# Patient Record
Sex: Female | Born: 1990 | Race: Black or African American | Hispanic: No | Marital: Single | Smoking: Current some day smoker
Health system: Southern US, Community
[De-identification: ages and names within clinical notes are randomized; demographics above are authoritative.]

## PROBLEM LIST (undated history)

## (undated) ENCOUNTER — Inpatient Hospital Stay (HOSPITAL_COMMUNITY): Payer: Self-pay

## (undated) DIAGNOSIS — Z789 Other specified health status: Secondary | ICD-10-CM

## (undated) DIAGNOSIS — O24419 Gestational diabetes mellitus in pregnancy, unspecified control: Secondary | ICD-10-CM

## (undated) HISTORY — PX: DILATION AND CURETTAGE OF UTERUS: SHX78

---

## 2015-12-14 ENCOUNTER — Emergency Department (HOSPITAL_COMMUNITY)
Admission: EM | Admit: 2015-12-14 | Discharge: 2015-12-14 | Disposition: A | Discharge status: Home / Self Care | Payer: Medicaid Other | Attending: Emergency Medicine | Admitting: Emergency Medicine

## 2015-12-14 ENCOUNTER — Encounter (HOSPITAL_COMMUNITY): Payer: Self-pay | Admitting: *Deleted

## 2015-12-14 DIAGNOSIS — W228XXA Striking against or struck by other objects, initial encounter: Secondary | ICD-10-CM | POA: Insufficient documentation

## 2015-12-14 DIAGNOSIS — Y998 Other external cause status: Secondary | ICD-10-CM | POA: Insufficient documentation

## 2015-12-14 DIAGNOSIS — F17200 Nicotine dependence, unspecified, uncomplicated: Secondary | ICD-10-CM | POA: Diagnosis not present

## 2015-12-14 DIAGNOSIS — Y9389 Activity, other specified: Secondary | ICD-10-CM | POA: Insufficient documentation

## 2015-12-14 DIAGNOSIS — S8992XA Unspecified injury of left lower leg, initial encounter: Secondary | ICD-10-CM | POA: Insufficient documentation

## 2015-12-14 DIAGNOSIS — S8001XA Contusion of right knee, initial encounter: Secondary | ICD-10-CM | POA: Diagnosis not present

## 2015-12-14 DIAGNOSIS — Y9289 Other specified places as the place of occurrence of the external cause: Secondary | ICD-10-CM | POA: Insufficient documentation

## 2015-12-14 DIAGNOSIS — S8991XA Unspecified injury of right lower leg, initial encounter: Secondary | ICD-10-CM | POA: Diagnosis present

## 2015-12-14 DIAGNOSIS — M25562 Pain in left knee: Secondary | ICD-10-CM

## 2015-12-14 NOTE — Discharge Instructions (Signed)
CONTINUE IBUPROFEN 600 MG EVERY 6 HOURS FOR PAIN AND INFLAMMATION. USE KNEE BRACE ON LEFT KNEE FOR COMFORT.  FOLLOW UP WITH YOUR CHOICE OF PRIMARY CARE PROVIDER OR WITH ORTHOPEDICS (DR. August Saucer) IF PAIN PERSISTS.    Cryotherapy Cryotherapy means treatment with cold. Ice or gel packs can be used to reduce both pain and swelling. Ice is the most helpful within the first 24 to 48 hours after an injury or flare-up from overusing a muscle or joint. Sprains, strains, spasms, burning pain, shooting pain, and aches can all be eased with ice. Ice can also be used when recovering from surgery. Ice is effective, has very few side effects, and is safe for most people to use. PRECAUTIONS  Ice is not a safe treatment option for people with:  Raynaud phenomenon. This is a condition affecting small blood vessels in the extremities. Exposure to cold may cause your problems to return.  Cold hypersensitivity. There are many forms of cold hypersensitivity, including:  Cold urticaria. Red, itchy hives appear on the skin when the tissues begin to warm after being iced.  Cold erythema. This is a red, itchy rash caused by exposure to cold.  Cold hemoglobinuria. Red blood cells break down when the tissues begin to warm after being iced. The hemoglobin that carry oxygen are passed into the urine because they cannot combine with blood proteins fast enough.  Numbness or altered sensitivity in the area being iced. If you have any of the following conditions, do not use ice until you have discussed cryotherapy with your caregiver:  Heart conditions, such as arrhythmia, angina, or chronic heart disease.  High blood pressure.  Healing wounds or open skin in the area being iced.  Current infections.  Rheumatoid arthritis.  Poor circulation.  Diabetes. Ice slows the blood flow in the region it is applied. This is beneficial when trying to stop inflamed tissues from spreading irritating chemicals to surrounding tissues.  However, if you expose your skin to cold temperatures for too long or without the proper protection, you can damage your skin or nerves. Watch for signs of skin damage due to cold. HOME CARE INSTRUCTIONS Follow these tips to use ice and cold packs safely.  Place a dry or damp towel between the ice and skin. A damp towel will cool the skin more quickly, so you may need to shorten the time that the ice is used.  For a more rapid response, add gentle compression to the ice.  Ice for no more than 10 to 20 minutes at a time. The bonier the area you are icing, the less time it will take to get the benefits of ice.  Check your skin after 5 minutes to make sure there are no signs of a poor response to cold or skin damage.  Rest 20 minutes or more between uses.  Once your skin is numb, you can end your treatment. You can test numbness by very lightly touching your skin. The touch should be so light that you do not see the skin dimple from the pressure of your fingertip. When using ice, most people will feel these normal sensations in this order: cold, burning, aching, and numbness.  Do not use ice on someone who cannot communicate their responses to pain, such as small children or people with dementia. HOW TO MAKE AN ICE PACK Ice packs are the most common way to use ice therapy. Other methods include ice massage, ice baths, and cryosprays. Muscle creams that cause a cold,  tingly feeling do not offer the same benefits that ice offers and should not be used as a substitute unless recommended by your caregiver. To make an ice pack, do one of the following:  Place crushed ice or a bag of frozen vegetables in a sealable plastic bag. Squeeze out the excess air. Place this bag inside another plastic bag. Slide the bag into a pillowcase or place a damp towel between your skin and the bag.  Mix 3 parts water with 1 part rubbing alcohol. Freeze the mixture in a sealable plastic bag. When you remove the mixture  from the freezer, it will be slushy. Squeeze out the excess air. Place this bag inside another plastic bag. Slide the bag into a pillowcase or place a damp towel between your skin and the bag. SEEK MEDICAL CARE IF:  You develop white spots on your skin. This may give the skin a blotchy (mottled) appearance.  Your skin turns blue or pale.  Your skin becomes waxy or hard.  Your swelling gets worse. MAKE SURE YOU:   Understand these instructions.  Will watch your condition.  Will get help right away if you are not doing well or get worse.   This information is not intended to replace advice given to you by your health care provider. Make sure you discuss any questions you have with your health care provider.   Document Released: 06/17/2011 Document Revised: 11/11/2014 Document Reviewed: 06/17/2011 Elsevier Interactive Patient Education [employer]

## 2015-12-14 NOTE — ED Provider Notes (Signed)
CSN: [id number]     Arrival date & time 12/14/15  2037 History  By signing my name below, I, Gonzella Lex, attest that this documentation has been prepared under the direction and in the presence of Elpidio Anis, PA-C. Electronically Signed: Gonzella Lex, Scribe. 12/14/2015. 9:50 PM.   Chief Complaint  Patient presents with  . Knee Pain   The history is provided by the patient. No language interpreter was used.   HPI Comments: Mariah Castaneda is a 25 y.o. female who presents to the Emergency Department complaining of sudden onset, constant, mild deep pain to her left knee, worse at night and worse with remaining stationary, below her patella for the past month. She also notes associated discomfort when sleeping and tingling pain which radiates into her thigh and lower leg. Pt states that she did not experience any type of accident, trauma or injury to her left knee before it began to hurt. She notes that her pain is improved with movement and also reports that she began working out and running on pavement a month ago.  Pt also complains of sudden onset, constant, mild, superficial pain to her right knee near her patella, worse with ambulation, which began yesterday evening after she hit her knee while helping move a headboard. She notes associated edema to her right knee which has improved since yesterday. Pt has taken ibuprofen for her left and right knee pain with no relief. Pt denies hx of past knee injury or surgery. She also denies erythema and edema of her left knee, numbness and tingling in her toes, hx of DM and hx of HTN. She does not take any medications regularly. Pt reports a family hx of HTN in her mother.   History reviewed. No pertinent past medical history. Past Surgical History  Procedure Laterality Date  . Cesarean section     No family history on file. Social History  Substance Use Topics  . Smoking status: Current Some Day Smoker  . Smokeless tobacco: None  .  Alcohol Use: No   OB History    No data available     Review of Systems  Musculoskeletal: Positive for myalgias, joint swelling ( right knee) and arthralgias.       Knees   Skin: Negative for color change.  Neurological: Negative for numbness ( left knee and leg).       Tingling sensation in knees bilaterally and in left leg    Allergies  Review of patient's allergies indicates not on file.  Home Medications   Prior to Admission medications   Not on File   BP 118/84 mmHg  Pulse 99  Temp(Src) 99.3 F (37.4 C) (Oral)  Resp 18  Ht 5' (1.524 m)  Wt 160 lb (72.576 kg)  BMI 31.25 kg/m2  SpO2 100%  LMP 10/16/2015 (Exact Date) Physical Exam  Constitutional: She is oriented to person, place, and time. She appears well-developed and well-nourished. No distress.  HENT:  Head: Normocephalic and atraumatic.  Eyes: Conjunctivae are normal.  Cardiovascular: Normal rate and intact distal pulses.   Pulmonary/Chest: Effort normal.  Abdominal: Soft.  Musculoskeletal:  Knees bilaterally without swelling, discoloration or deformity. Joints are stable without laxity. Left knee: no focal tenderness. Right knee: mildly tender medial aspect patella. No deformity. No calf or thigh tenderness bilaterally.  Neurological: She is alert and oriented to person, place, and time.  Skin: Skin is warm and dry.  Psychiatric: She has a normal mood and affect.  Nursing note  and vitals reviewed.   ED Course  Procedures  DIAGNOSTIC STUDIES:    Oxygen Saturation is 100% on RA, normal by my interpretation.   COORDINATION OF CARE:  9:51 PM  Discussed treatment plan with pt at bedside and pt agreed to plan.   Labs Review Labs Reviewed - No data to display  Imaging Review No results found. I have personally reviewed and evaluated these images and lab results as part of my medical decision-making.   EKG Interpretation None      MDM   Final diagnoses:  None    1. Left knee pain 2. Right  knee contusion  Patient is ambulatory without difficulty. No suspicion for septic joint, fractures or dislocations. Discussed supportive care and appropriate follow up if symptoms persist.    I personally performed the services described in this documentation, which was scribed in my presence. The recorded information has been reviewed and is accurate.     Elpidio Anis, PA-C 12/14/15 0454  Nelva Nay, MD 12/16/15 1040

## 2015-12-14 NOTE — ED Notes (Signed)
Pt c/o left knee pain x 1 month and hit her right knee on a headboard and c/o swelling and pain.

## 2016-02-04 ENCOUNTER — Encounter (HOSPITAL_COMMUNITY): Payer: Self-pay | Admitting: *Deleted

## 2016-02-04 ENCOUNTER — Inpatient Hospital Stay (HOSPITAL_COMMUNITY)
Admission: AD | Admit: 2016-02-04 | Discharge: 2016-02-04 | Disposition: A | Discharge status: Home / Self Care | Payer: Medicaid Other | Source: Ambulatory Visit | Attending: Obstetrics and Gynecology | Admitting: Obstetrics and Gynecology

## 2016-02-04 ENCOUNTER — Inpatient Hospital Stay (HOSPITAL_COMMUNITY): Payer: Medicaid Other

## 2016-02-04 DIAGNOSIS — Z3A01 Less than 8 weeks gestation of pregnancy: Secondary | ICD-10-CM | POA: Insufficient documentation

## 2016-02-04 DIAGNOSIS — F17200 Nicotine dependence, unspecified, uncomplicated: Secondary | ICD-10-CM | POA: Diagnosis not present

## 2016-02-04 DIAGNOSIS — R109 Unspecified abdominal pain: Secondary | ICD-10-CM | POA: Diagnosis not present

## 2016-02-04 DIAGNOSIS — O26891 Other specified pregnancy related conditions, first trimester: Secondary | ICD-10-CM | POA: Diagnosis not present

## 2016-02-04 DIAGNOSIS — O9989 Other specified diseases and conditions complicating pregnancy, childbirth and the puerperium: Secondary | ICD-10-CM

## 2016-02-04 DIAGNOSIS — O3680X0 Pregnancy with inconclusive fetal viability, not applicable or unspecified: Secondary | ICD-10-CM

## 2016-02-04 DIAGNOSIS — O26899 Other specified pregnancy related conditions, unspecified trimester: Secondary | ICD-10-CM

## 2016-02-04 DIAGNOSIS — O99331 Smoking (tobacco) complicating pregnancy, first trimester: Secondary | ICD-10-CM | POA: Diagnosis not present

## 2016-02-04 DIAGNOSIS — N898 Other specified noninflammatory disorders of vagina: Secondary | ICD-10-CM | POA: Diagnosis present

## 2016-02-04 HISTORY — DX: Other specified health status: Z78.9

## 2016-02-04 LAB — URINALYSIS, ROUTINE W REFLEX MICROSCOPIC
BILIRUBIN URINE: NEGATIVE
Glucose, UA: NEGATIVE mg/dL
Hgb urine dipstick: NEGATIVE
KETONES UR: 15 mg/dL — AB
LEUKOCYTES UA: NEGATIVE
NITRITE: NEGATIVE
PH: 6 (ref 5.0–8.0)
PROTEIN: NEGATIVE mg/dL
Specific Gravity, Urine: 1.025 (ref 1.005–1.030)

## 2016-02-04 LAB — HCG, QUANTITATIVE, PREGNANCY: HCG, BETA CHAIN, QUANT, S: 1289 m[IU]/mL — AB (ref <5)

## 2016-02-04 LAB — POCT PREGNANCY, URINE: PREG TEST UR: POSITIVE — AB

## 2016-02-04 LAB — CBC
HEMATOCRIT: 36 % (ref 36.0–46.0)
HEMOGLOBIN: 12.9 g/dL (ref 12.0–15.0)
MCH: 30.5 pg (ref 26.0–34.0)
MCHC: 35.8 g/dL (ref 30.0–36.0)
MCV: 85.1 fL (ref 78.0–100.0)
Platelets: 251 10*3/uL (ref 150–400)
RBC: 4.23 MIL/uL (ref 3.87–5.11)
RDW: 13.5 % (ref 11.5–15.5)
WBC: 6.5 10*3/uL (ref 4.0–10.5)

## 2016-02-04 LAB — WET PREP, GENITAL
Sperm: NONE SEEN
TRICH WET PREP: NONE SEEN
YEAST WET PREP: NONE SEEN

## 2016-02-04 NOTE — MAU Note (Signed)
Patient presents with a +HPT this morning and c/o abdominal pain today as well as vaginal discharge since Friday.

## 2016-02-04 NOTE — Discharge Instructions (Signed)

## 2016-02-04 NOTE — MAU Provider Note (Signed)
History     CSN: [id number]  Arrival date and time: 02/04/16 2042   First Provider Initiated Contact with Patient 02/04/16 2114      Chief Complaint  Patient presents with  . Vaginal Discharge   Vaginal Discharge The patient's primary symptoms include pelvic pain and vaginal discharge. This is a new problem. The current episode started yesterday. The problem occurs constantly. The problem has been unchanged. Pain severity now: 4/10  The problem affects the right side. She is pregnant. Associated symptoms include abdominal pain. Pertinent negatives include no chills, constipation, diarrhea, dysuria, fever, frequency, nausea, urgency or vomiting. The vaginal discharge was malodorous and white. There has been no bleeding. Nothing aggravates the symptoms. She has tried nothing for the symptoms. She is sexually active. It is possible that her partner has an STD. She uses nothing for contraception. Her menstrual history has been regular (LMP 12/31/15 ).    Past Medical History  Diagnosis Date  . Medical history non-contributory     Past Surgical History  Procedure Laterality Date  . Cesarean section    . Dilation and curettage of uterus      History reviewed. No pertinent family history.  Social History  Substance Use Topics  . Smoking status: Current Some Day Smoker  . Smokeless tobacco: Never Used  . Alcohol Use: No    Allergies: Allergies not on file  No prescriptions prior to admission    Review of Systems  Constitutional: Negative for fever and chills.  Gastrointestinal: Positive for abdominal pain. Negative for nausea, vomiting, diarrhea and constipation.  Genitourinary: Positive for vaginal discharge and pelvic pain. Negative for dysuria, urgency and frequency.   Physical Exam   Blood pressure 130/80, pulse 97, temperature 98.6 F (37 C), temperature source Oral, resp. rate 20, height 5' (1.524 m), weight 72.576 kg (160 lb), last menstrual period  12/31/2015.  Physical Exam  Nursing note and vitals reviewed. Constitutional: She is oriented to person, place, and time. She appears well-developed and well-nourished. No distress.  HENT:  Head: Normocephalic.  Cardiovascular: Normal rate.   Respiratory: Effort normal.  GI: Soft. There is no tenderness. There is no rebound.  Musculoskeletal: Normal range of motion.  Neurological: She is alert and oriented to person, place, and time.  Skin: Skin is warm and dry.  Psychiatric: She has a normal mood and affect.   Results for orders placed or performed during the hospital encounter of 02/04/16 (from the past 24 hour(s))  Urinalysis, Routine w reflex microscopic (not at [facility])     Status: Abnormal   Collection Time: 02/04/16  8:50 PM  Result Value Ref Range   Color, Urine YELLOW YELLOW   APPearance CLEAR CLEAR   Specific Gravity, Urine 1.025 1.005 - 1.030   pH 6.0 5.0 - 8.0   Glucose, UA NEGATIVE NEGATIVE mg/dL   Hgb urine dipstick NEGATIVE NEGATIVE   Bilirubin Urine NEGATIVE NEGATIVE   Ketones, ur 15 (A) NEGATIVE mg/dL   Protein, ur NEGATIVE NEGATIVE mg/dL   Nitrite NEGATIVE NEGATIVE   Leukocytes, UA NEGATIVE NEGATIVE  Pregnancy, urine POC     Status: Abnormal   Collection Time: 02/04/16  9:10 PM  Result Value Ref Range   Preg Test, Ur POSITIVE (A) NEGATIVE  Wet prep, genital     Status: Abnormal   Collection Time: 02/04/16  9:13 PM  Result Value Ref Range   Yeast Wet Prep HPF POC NONE SEEN NONE SEEN   Trich, Wet Prep NONE SEEN NONE SEEN  Clue Cells Wet Prep HPF POC PRESENT (A) NONE SEEN   WBC, Wet Prep HPF POC FEW (A) NONE SEEN   Sperm NONE SEEN   CBC     Status: None   Collection Time: 02/04/16  9:21 PM  Result Value Ref Range   WBC 6.5 4.0 - 10.5 K/uL   RBC 4.23 3.87 - 5.11 MIL/uL   Hemoglobin 12.9 12.0 - 15.0 g/dL   HCT 69.636.0 29.536.0 - 28.446.0 %   MCV 85.1 78.0 - 100.0 fL   MCH 30.5 26.0 - 34.0 pg   MCHC 35.8 30.0 - 36.0 g/dL   RDW 13.213.5 44.011.5 - 10.215.5 %   Platelets 251 150  - 400 K/uL  ABO/Rh     Status: None (Preliminary result)   Collection Time: 02/04/16  9:21 PM  Result Value Ref Range   ABO/RH(D) A POS   hCG, quantitative, pregnancy     Status: Abnormal   Collection Time: 02/04/16  9:21 PM  Result Value Ref Range   hCG, Beta Chain, Quant, S 1289 (H) <5 mIU/mL   [place] Ob Comp Less 14 Wks  02/04/2016  CLINICAL DATA:  Acute onset of intermittent pelvic pain. Initial encounter. EXAM: OBSTETRIC <14 WK US AND TRANSVAGINAL OB US TECHNIQUE: Both transabdominal and transvaginal ultrasound examinations were performed for complete evaluation of the gestation as well as the maternal uterus, adnexal regions, and pelvic cul-de-sac. Transvaginal technique was performed to assess early pregnancy. COMPARISON:  None. FINDINGS: Intrauterine gestational sac: Probably seen. Yolk sac:  No Embryo:  No MSD: 2.3 mm   4 w   6  d Subchorionic hemorrhage:  None visualized. Maternal uterus/adnexae: The uterus is otherwise unremarkable. The ovaries are within normal limits. The right ovary measures 2.8 x 1.2 x 1.8 cm, while the left ovary measures 3.4 x 2.7 x 2.4 cm. No suspicious adnexal masses are seen; there is no evidence for ovarian torsion. No free fluid is seen within the pelvic cul-de-sac. IMPRESSION: Single 2 mm intrauterine gestational sac suggested, corresponding to a gestational age of [redacted] weeks 6 days. This matches the gestational age of [redacted] weeks 0 days by LMP, reflecting an estimated date of delivery of October 06, 2016. Electronically Signed   By: Roanna RaiderJeffery  Chang M.D.   On: 02/04/2016 22:26   [place] Ob Transvaginal  02/04/2016  CLINICAL DATA:  Acute onset of intermittent pelvic pain. Initial encounter. EXAM: OBSTETRIC <14 WK US AND TRANSVAGINAL OB US TECHNIQUE: Both transabdominal and transvaginal ultrasound examinations were performed for complete evaluation of the gestation as well as the maternal uterus, adnexal regions, and pelvic cul-de-sac. Transvaginal technique was performed to assess  early pregnancy. COMPARISON:  None. FINDINGS: Intrauterine gestational sac: Probably seen. Yolk sac:  No Embryo:  No MSD: 2.3 mm   4 w   6  d Subchorionic hemorrhage:  None visualized. Maternal uterus/adnexae: The uterus is otherwise unremarkable. The ovaries are within normal limits. The right ovary measures 2.8 x 1.2 x 1.8 cm, while the left ovary measures 3.4 x 2.7 x 2.4 cm. No suspicious adnexal masses are seen; there is no evidence for ovarian torsion. No free fluid is seen within the pelvic cul-de-sac. IMPRESSION: Single 2 mm intrauterine gestational sac suggested, corresponding to a gestational age of [redacted] weeks 6 days. This matches the gestational age of [redacted] weeks 0 days by LMP, reflecting an estimated date of delivery of October 06, 2016. Electronically Signed   By: Roanna RaiderJeffery  Chang M.D.   On: 02/04/2016 22:26  MAU Course  Procedures  MDM   Assessment and Plan   1. Pregnancy, location unknown   2. Abdominal pain in pregnancy    DC home Comfort measures reviewed  1st Trimester precautions  Bleeding precautions Ectopic precautions RX: NONE  Return to MAU as needed   Follow-up Information    Follow up with [facility].   Specialty:  Obstetrics and Gynecology   Why:  Wednesday 02/07/16 at 11:00 AM   Contact information:   [address] [place] [address] [phone]        Tawnya Crook 02/04/2016, 9:17 PM

## 2016-02-05 LAB — GC/CHLAMYDIA PROBE AMP (~~LOC~~) NOT AT ARMC
Chlamydia: POSITIVE — AB
Neisseria Gonorrhea: NEGATIVE

## 2016-02-05 LAB — ABO/RH: ABO/RH(D): A POS

## 2016-02-06 ENCOUNTER — Telehealth (HOSPITAL_COMMUNITY): Payer: Self-pay | Admitting: *Deleted

## 2016-02-06 DIAGNOSIS — A749 Chlamydial infection, unspecified: Secondary | ICD-10-CM

## 2016-02-06 LAB — HIV ANTIBODY (ROUTINE TESTING W REFLEX): HIV Screen 4th Generation wRfx: NONREACTIVE

## 2016-02-06 LAB — RPR: RPR Ser Ql: NONREACTIVE

## 2016-02-06 MED ORDER — AZITHROMYCIN 500 MG PO TABS
ORAL_TABLET | ORAL | Status: DC
Start: 2016-02-06 — End: 2016-03-10

## 2016-02-06 NOTE — Telephone Encounter (Signed)
Telephone call to patient regarding positive chlamydia culture, patient notified.  Patient has not been treated.  Rx routed to pharmacy per protocol.  Instructed patient to notify her partner for treatment and to abstain from sex for seven days post treatment.  Report faxed to health department.

## 2016-02-07 ENCOUNTER — Ambulatory Visit (INDEPENDENT_AMBULATORY_CARE_PROVIDER_SITE_OTHER): Payer: Medicaid Other | Admitting: Obstetrics and Gynecology

## 2016-02-07 DIAGNOSIS — O9989 Other specified diseases and conditions complicating pregnancy, childbirth and the puerperium: Secondary | ICD-10-CM | POA: Diagnosis present

## 2016-02-07 DIAGNOSIS — R102 Pelvic and perineal pain: Secondary | ICD-10-CM | POA: Diagnosis not present

## 2016-02-07 DIAGNOSIS — O3680X0 Pregnancy with inconclusive fetal viability, not applicable or unspecified: Secondary | ICD-10-CM

## 2016-02-07 LAB — HCG, QUANTITATIVE, PREGNANCY: HCG, BETA CHAIN, QUANT, S: 5423 m[IU]/mL — AB (ref <5)

## 2016-02-07 NOTE — Progress Notes (Signed)
Patient ID: Mariah Castaneda, female   DOB: 21-Jul-1991, 25 y.o.   MRN: [id number] Ms. Mariah Castaneda  is a 25 y.o. [id number] at Unknown who [facility] today for follow-up quant hCG after 48 hours. The patient was seen in MAU on 02/04/16 and had quant hCG of 1800 and US showed possible IUGS. She denies pain, vaginal bleeding or fever today.   OB History  Gravida Para Term Preterm AB SAB TAB Ectopic Multiple Living  3 2   1  1   2     # Outcome Date GA Lbr Len/2nd Weight Sex Delivery Anes PTL Lv  3 TAB           2 Para           1 Para               Past Medical History  Diagnosis Date  . Medical history non-contributory      LMP 12/31/2015  CONSTITUTIONAL: Well-developed, well-nourished female in no acute distress.  ENT: External right and left ear normal.  EYES: EOM intact, conjunctivae normal.  MUSCULOSKELETAL: Normal range of motion.  CARDIOVASCULAR: Regular heart rate RESPIRATORY: Normal effort NEUROLOGICAL: Alert and oriented to person, place, and time.  SKIN: Skin is warm and dry. No rash noted. Not diaphoretic. No erythema. No pallor. PSYCH: Normal mood and affect. Normal behavior. Normal judgment and thought content.  Results for orders placed or performed in visit on 02/07/16 (from the past 24 hour(s))  B-HCG Quant     Status: Abnormal   Collection Time: 02/07/16  1:08 PM  Result Value Ref Range   hCG, Beta Chain, Quant, S 5423 (H) <5 mIU/mL    A: Appropriate rise in quant hCG after 48 hours  P: Discharge home First trimester/ectopic precautions discussed Patient will return for follow-up US in 1 week. Order placed and RN to schedule. [facility]  Patient may return to MAU as needed or if her condition were to change or worsen   Catalina AntiguaPeggy Lorain Fettes, MD 02/07/2016 2:54 PM

## 2016-02-07 NOTE — Progress Notes (Signed)
Here for bhcg. Denies any severe bleeding or problems.

## 2016-02-14 ENCOUNTER — Ambulatory Visit (HOSPITAL_COMMUNITY): Admission: RE | Admit: 2016-02-14 | Payer: Medicaid Other | Source: Ambulatory Visit

## 2016-02-23 ENCOUNTER — Ambulatory Visit (HOSPITAL_COMMUNITY)
Admission: RE | Admit: 2016-02-23 | Discharge: 2016-02-23 | Disposition: A | Discharge status: Home / Self Care | Payer: Medicaid Other | Source: Ambulatory Visit | Attending: Obstetrics and Gynecology | Admitting: Obstetrics and Gynecology

## 2016-02-23 DIAGNOSIS — Z3A01 Less than 8 weeks gestation of pregnancy: Secondary | ICD-10-CM | POA: Diagnosis not present

## 2016-02-23 DIAGNOSIS — N8312 Corpus luteum cyst of left ovary: Secondary | ICD-10-CM | POA: Insufficient documentation

## 2016-02-23 DIAGNOSIS — O3481 Maternal care for other abnormalities of pelvic organs, first trimester: Secondary | ICD-10-CM | POA: Insufficient documentation

## 2016-02-23 DIAGNOSIS — Z36 Encounter for antenatal screening of mother: Secondary | ICD-10-CM | POA: Insufficient documentation

## 2016-02-23 DIAGNOSIS — O3680X0 Pregnancy with inconclusive fetal viability, not applicable or unspecified: Secondary | ICD-10-CM

## 2016-02-26 ENCOUNTER — Other Ambulatory Visit: Payer: Self-pay

## 2016-02-26 ENCOUNTER — Telehealth: Payer: Self-pay

## 2016-02-26 MED ORDER — PRENATAL VITAMINS PLUS 27-1 MG PO TABS: 1.0000 | ORAL_TABLET | Freq: Once | ORAL | Status: AC

## 2016-02-26 NOTE — Telephone Encounter (Signed)
Please inform the patient of a normal pregnancy with EDC of 10/11/2016. Patient should start prenatal vitamins. She should also start prenatal care      Pt has been contacted regarding results. She will follow up with [facility] to start care.

## 2016-03-10 ENCOUNTER — Encounter (HOSPITAL_COMMUNITY): Payer: Self-pay | Admitting: *Deleted

## 2016-03-10 ENCOUNTER — Inpatient Hospital Stay (HOSPITAL_COMMUNITY)
Admission: AD | Admit: 2016-03-10 | Discharge: 2016-03-10 | Disposition: A | Discharge status: Home / Self Care | Payer: Medicaid Other | Source: Ambulatory Visit | Attending: Obstetrics & Gynecology | Admitting: Obstetrics & Gynecology

## 2016-03-10 DIAGNOSIS — N949 Unspecified condition associated with female genital organs and menstrual cycle: Secondary | ICD-10-CM

## 2016-03-10 DIAGNOSIS — O219 Vomiting of pregnancy, unspecified: Secondary | ICD-10-CM | POA: Diagnosis not present

## 2016-03-10 DIAGNOSIS — O99611 Diseases of the digestive system complicating pregnancy, first trimester: Secondary | ICD-10-CM | POA: Diagnosis not present

## 2016-03-10 DIAGNOSIS — R1032 Left lower quadrant pain: Secondary | ICD-10-CM | POA: Diagnosis present

## 2016-03-10 DIAGNOSIS — O210 Mild hyperemesis gravidarum: Secondary | ICD-10-CM | POA: Diagnosis not present

## 2016-03-10 DIAGNOSIS — K117 Disturbances of salivary secretion: Secondary | ICD-10-CM | POA: Diagnosis not present

## 2016-03-10 DIAGNOSIS — O9989 Other specified diseases and conditions complicating pregnancy, childbirth and the puerperium: Secondary | ICD-10-CM

## 2016-03-10 DIAGNOSIS — O99331 Smoking (tobacco) complicating pregnancy, first trimester: Secondary | ICD-10-CM | POA: Diagnosis not present

## 2016-03-10 DIAGNOSIS — Z3A10 10 weeks gestation of pregnancy: Secondary | ICD-10-CM | POA: Diagnosis not present

## 2016-03-10 LAB — URINE MICROSCOPIC-ADD ON
Bacteria, UA: NONE SEEN
RBC / HPF: NONE SEEN RBC/hpf (ref 0–5)

## 2016-03-10 LAB — URINALYSIS, ROUTINE W REFLEX MICROSCOPIC
BILIRUBIN URINE: NEGATIVE
GLUCOSE, UA: NEGATIVE mg/dL
HGB URINE DIPSTICK: NEGATIVE
KETONES UR: NEGATIVE mg/dL
Nitrite: NEGATIVE
PH: 6 (ref 5.0–8.0)
PROTEIN: NEGATIVE mg/dL
Specific Gravity, Urine: 1.025 (ref 1.005–1.030)

## 2016-03-10 MED ORDER — GLYCOPYRROLATE 2 MG PO TABS: 2.0000 mg | ORAL_TABLET | Freq: Three times a day (TID) | ORAL | Status: AC

## 2016-03-10 MED ORDER — PROMETHAZINE HCL 12.5 MG PO TABS: 12.5000 mg | ORAL_TABLET | Freq: Four times a day (QID) | ORAL | Status: DC | PRN

## 2016-03-10 NOTE — MAU Note (Signed)
Pt presents to MAU with complaints nausea and vomiting with lower abdominal cramping.Denies any vaginal bleeding or abnormal discharge

## 2016-03-10 NOTE — Discharge Instructions (Signed)
Morning Sickness Morning sickness is when you feel sick to your stomach (nauseous) during pregnancy. This nauseous feeling may or may not come with vomiting. It often occurs in the morning but can be a problem any time of day. Morning sickness is most common during the first trimester, but it may continue throughout pregnancy. While morning sickness is unpleasant, it is usually harmless unless you develop severe and continual vomiting (hyperemesis gravidarum). This condition requires more intense treatment.  CAUSES  The cause of morning sickness is not completely known but seems to be related to normal hormonal changes that occur in pregnancy. RISK FACTORS You are at greater risk if you:  Experienced nausea or vomiting before your pregnancy.  Had morning sickness during a previous pregnancy.  Are pregnant with more than one baby, such as twins. TREATMENT  Do not use any medicines (prescription, over-the-counter, or herbal) for morning sickness without first talking to your health care provider. Your health care provider may prescribe or recommend:  Vitamin B6 supplements.  Anti-nausea medicines.  The herbal medicine ginger. HOME CARE INSTRUCTIONS   Only take over-the-counter or prescription medicines as directed by your health care provider.  Taking multivitamins before getting pregnant can prevent or decrease the severity of morning sickness in most women.  Eat a piece of dry toast or unsalted crackers before getting out of bed in the morning.  Eat five or six small meals a day.  Eat dry and bland foods (rice, baked potato). Foods high in carbohydrates are often helpful.  Do not drink liquids with your meals. Drink liquids between meals.  Avoid greasy, fatty, and spicy foods.  Get someone to cook for you if the smell of any food causes nausea and vomiting.  If you feel nauseous after taking prenatal vitamins, take the vitamins at night or with a snack.  Snack on protein  foods (nuts, yogurt, cheese) between meals if you are hungry.  Eat unsweetened gelatins for desserts.  Wearing an acupressure wristband (worn for sea sickness) may be helpful.  Acupuncture may be helpful.  Do not smoke.  Get a humidifier to keep the air in your house free of odors.  Get plenty of fresh air. SEEK MEDICAL CARE IF:   Your home remedies are not working, and you need medicine.  You feel dizzy or lightheaded.  You are losing weight. SEEK IMMEDIATE MEDICAL CARE IF:   You have persistent and uncontrolled nausea and vomiting.  You pass out (faint). MAKE SURE YOU:  Understand these instructions.  Will watch your condition.  Will get help right away if you are not doing well or get worse.   This information is not intended to replace advice given to you by your health care provider. Make sure you discuss any questions you have with your health care provider.   Document Released: 12/12/2006 Document Revised: 10/26/2013 Document Reviewed: 04/07/2013 Elsevier Interactive Patient Education 2016 Else[employer] Inc[employer] Round Ligament Pain The round ligament is a cord of muscle and tissue that helps to support the uterus. It can become a source of pain during pregnancy if it becomes stretched or twisted as the baby grows. The pain usually begins in the second trimester of pregnancy, and it can come and go until the baby is delivered. It is not a serious problem, and it does not cause harm to the baby. Round ligament pain is usually a short, sharp, and pinching pain, but it can also be a dull, lingering, and aching pain. The pain is felt  in the lower side of the abdomen or in the groin. It usually starts deep in the groin and moves up to the outside of the hip area. Pain can occur with:  A sudden change in position.  Rolling over in bed.  Coughing or sneezing.  Physical activity. HOME CARE INSTRUCTIONS Watch your condition for any changes. Take these steps to help with your  pain:  When the pain starts, relax. Then try:  Sitting down.  Flexing your knees up to your abdomen.  Lying on your side with one pillow under your abdomen and another pillow between your legs.  Sitting in a warm bath for 15-20 minutes or until the pain goes away.  Take over-the-counter and prescription medicines only as told by your health care provider.  Move slowly when you sit and stand.  Avoid long walks if they cause pain.  Stop or lessen your physical activities if they cause pain. SEEK MEDICAL CARE IF:  Your pain does not go away with treatment.  You feel pain in your back that you did not have before.  Your medicine is not helping. SEEK IMMEDIATE MEDICAL CARE IF:  You develop a fever or chills.  You develop uterine contractions.  You develop vaginal bleeding.  You develop nausea or vomiting.  You develop diarrhea.  You have pain when you urinate.   This information is not intended to replace advice given to you by your health care provider. Make sure you discuss any questions you have with your health care provider.   Document Released: 07/30/2008 Document Revised: 01/13/2012 Document Reviewed: 12/28/2014 Elsevier Interactive Patient Education [employer]

## 2016-03-10 NOTE — MAU Provider Note (Signed)
History     CSN: [id number]  Arrival date & time 03/10/16  1257   First Provider Initiated Contact with Patient 03/10/16 1414      No chief complaint on file.  SUBJECTIVE:  HPI Comments: Mariah Castaneda is a 25 y.o. [phone] at [redacted]w[redacted]d presenting with left groin and suprapubic sharp crampy abdominal pain. Pain is worse during vomiting. She has had vomiting 2-3 times a day but today has retained Gatorade and some solid food. Also spitting a lot.  Abdominal Pain This is a recurrent problem. The current episode started 1 to 4 weeks ago. The onset quality is undetermined. The problem occurs 2 to 4 times per day. The problem has been unchanged. The pain is located in the LLQ and suprapubic region. The pain is at a severity of 5/10. The pain is mild. The quality of the pain is cramping. The abdominal pain does not radiate. Pertinent negatives include no anorexia, constipation, diarrhea, dysuria, fever or hematuria. The pain is aggravated by vomiting and movement.     [facility] at [facility]  Past Medical History  Diagnosis Date  . Medical history non-contributory     Past Surgical History  Procedure Laterality Date  . Cesarean section    . Dilation and curettage of uterus      History reviewed. No pertinent family history.  Social History  Substance Use Topics  . Smoking status: Current Some Day Smoker  . Smokeless tobacco: Never Used  . Alcohol Use: No    OB History    Gravida Para Term Preterm AB TAB SAB Ectopic Multiple Living   Review of Systems  Constitutional: Negative for fever.  Gastrointestinal: Positive for abdominal pain. Negative for diarrhea, constipation and anorexia.  Genitourinary: Negative for dysuria and hematuria.    Allergies  Review of patient's allergies indicates no known allergies.  Home Medications   Not taking any meds   OBJECTIVE:  BP 125/81 mmHg  Pulse 101  Temp(Src) 98.4 F (36.9 C)  Resp 16  Ht  (1.6 m)  Wt 70.308 kg (155  lb)  BMI 27.46 kg/m2  LMP 12/31/2015  Physical Exam  Constitutional: She is oriented to person, place, and time. She appears well-developed and well-nourished. No distress.  Spitting into cup  HENT:  Head: Normocephalic.  Eyes: Pupils are equal, round, and reactive to light.  Neck: Normal range of motion.  Cardiovascular: Normal rate.   Pulmonary/Chest: Effort normal.  Abdominal: Soft. There is no tenderness. There is no guarding.  Neurological: She is alert and oriented to person, place, and time.  Skin: Skin is warm and dry.  Nursing note and vitals reviewed.   MAU Course  Procedures (including critical care time)  Labs Reviewed  URINALYSIS, ROUTINE W REFLEX MICROSCOPIC (NOT AT [facility]) - Abnormal; Notable for the following:    APPearance HAZY (*)    Leukocytes, UA TRACE (*)    All other components within normal limits  URINE MICROSCOPIC-ADD ON - Abnormal; Notable for the following:    Squamous Epithelial / LPF 0-5 (*)    All other components within normal limits   Bedside US by me: normal cardiac activity and movement  ASSESSMENT/PLAN: 1. Round ligament pain   2. Ptyalism   3. Nausea and vomiting during pregnancy        Medication List    STOP taking these medications        azithromycin 500  MG tablet  Commonly known as:  ZITHROMAX      TAKE these medications        glycopyrrolate 2 MG tablet  Commonly known as:  ROBINUL-FORTE  Take 1 tablet (2 mg total) by mouth 3 (three) times daily.     PRENATAL VITAMINS PLUS 27-1 MG Tabs  Take 1 tablet by mouth once.     promethazine 12.5 MG tablet  Commonly known as:  PHENERGAN  Take 1 tablet (12.5 mg total) by mouth every 6 (six) hours as needed for nausea or vomiting.

## 2016-03-18 ENCOUNTER — Ambulatory Visit: Payer: Medicaid Other | Admitting: Obstetrics and Gynecology

## 2016-10-02 IMAGING — US US OB TRANSVAGINAL
1 series · 15 of 28 positions shown · non-contrast
Comparison: None.

CLINICAL DATA: Acute onset of intermittent pelvic pain. Initial
encounter.

EXAM:
OBSTETRIC <14 WK US AND TRANSVAGINAL OB US
TECHNIQUE: Both transabdominal and transvaginal ultrasound examinations were
performed for complete evaluation of the gestation as well as the
maternal uterus, adnexal regions, and pelvic cul-de-sac.
Transvaginal technique was performed to assess early pregnancy.

[Series 1: us ob transvaginal · 15 of 55 slices shown]
[im 1/55]
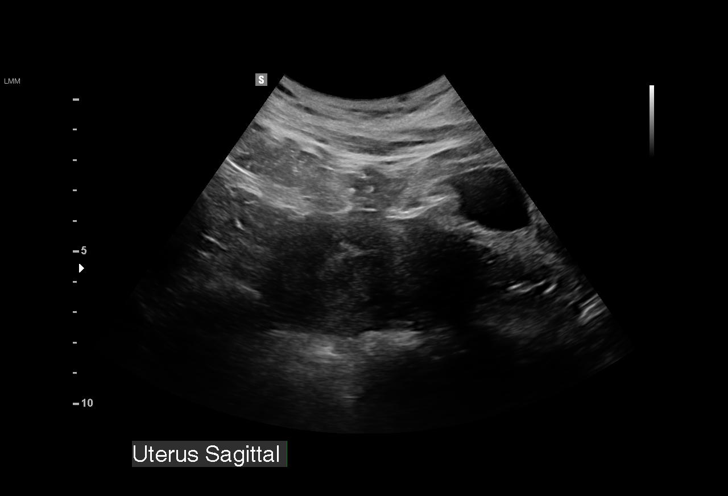
[im 5/55]
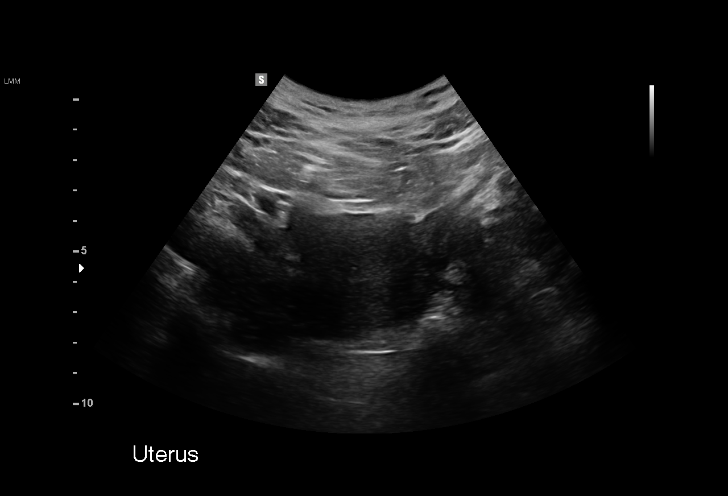
[im 9/55]
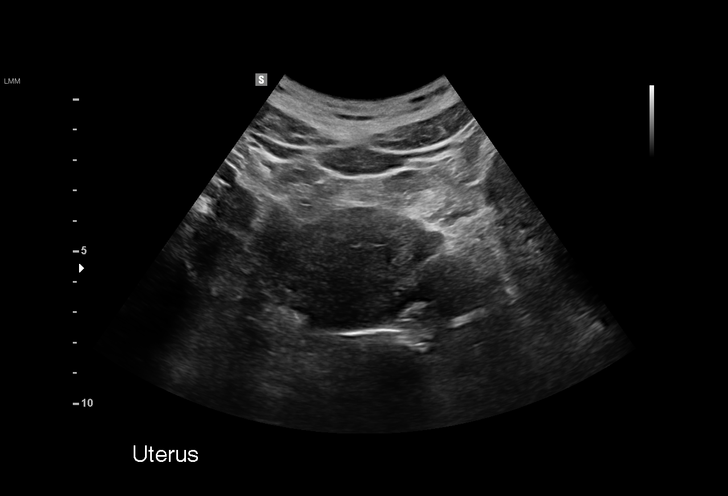
[im 13/55]
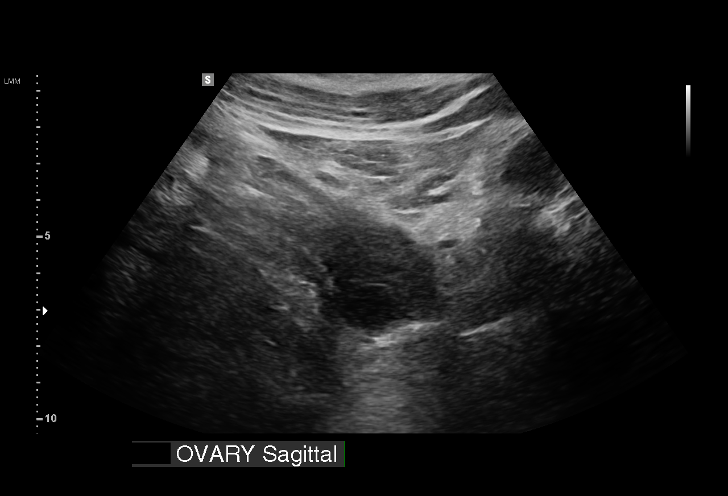
[im 17/55]
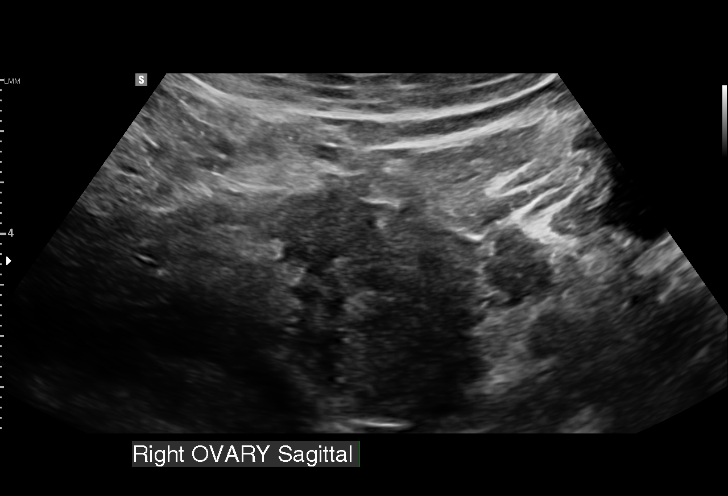
[im 21/55]
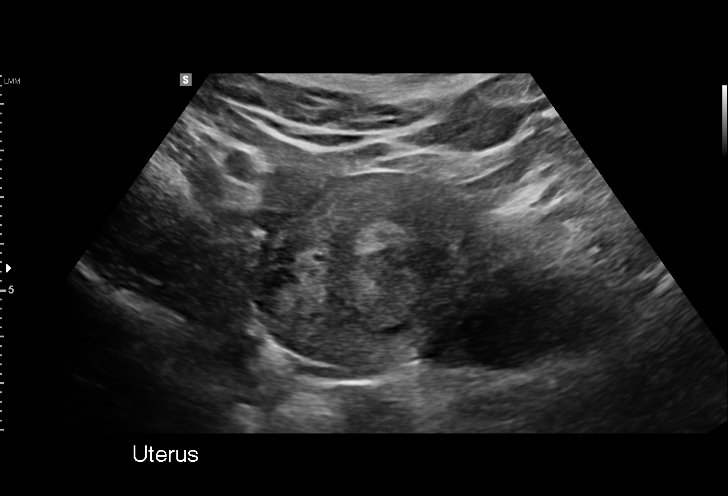
[im 25/55]
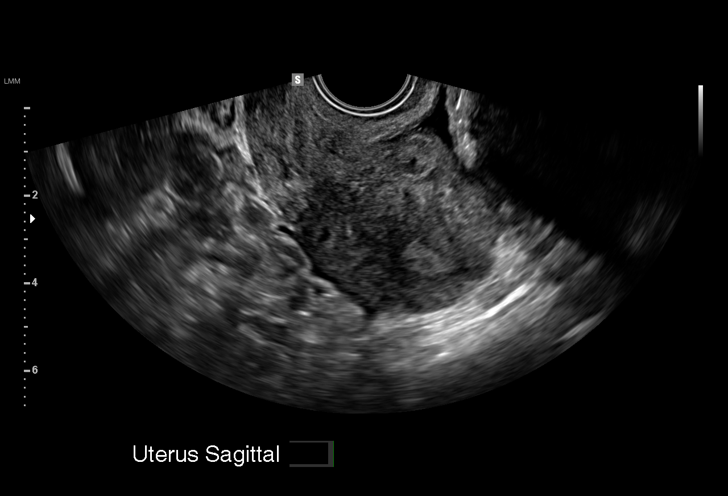
[im 29/55]
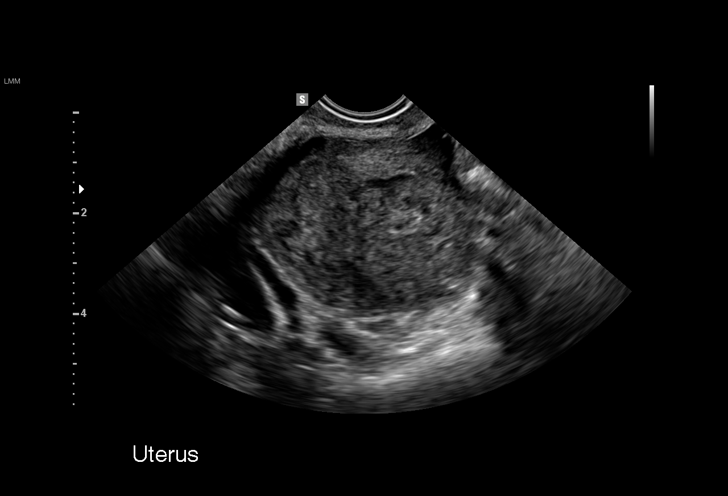
[im 31/55]
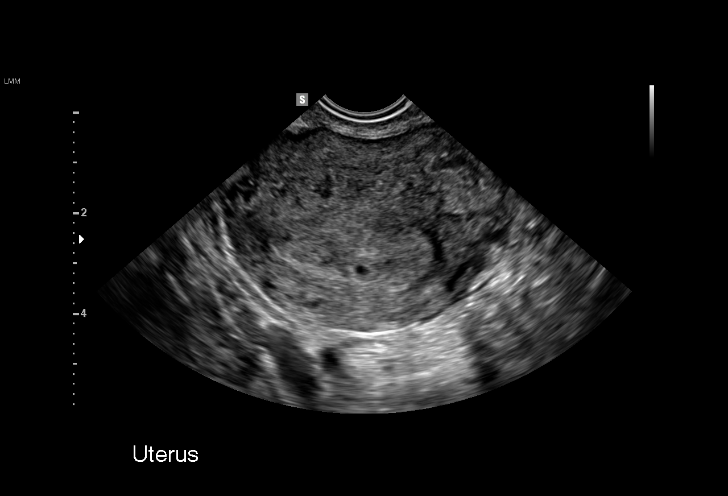
[im 35/55]
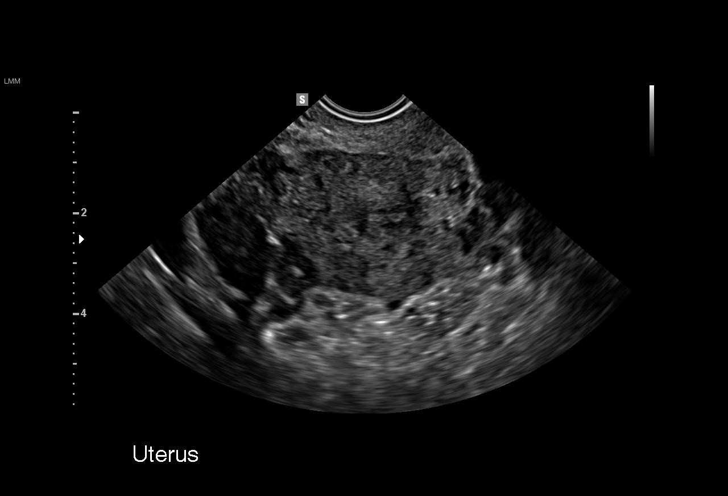
[im 39/55]
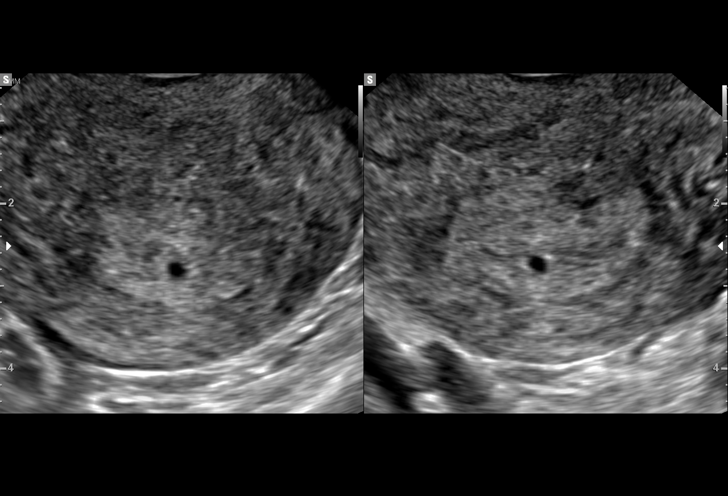
[im 43/55]
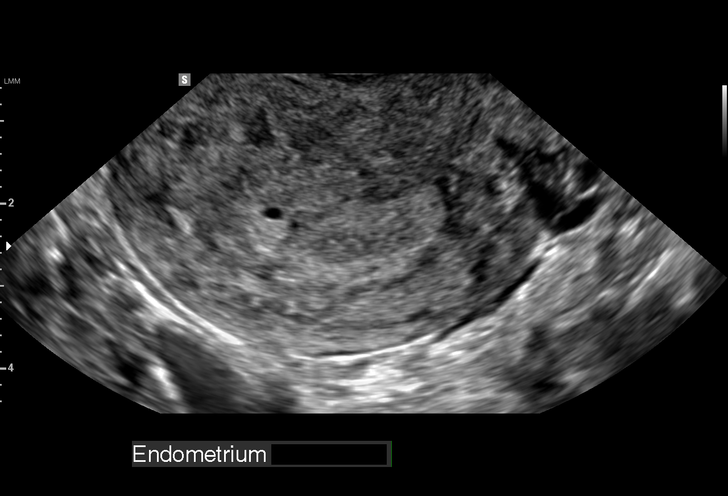
[im 47/55]
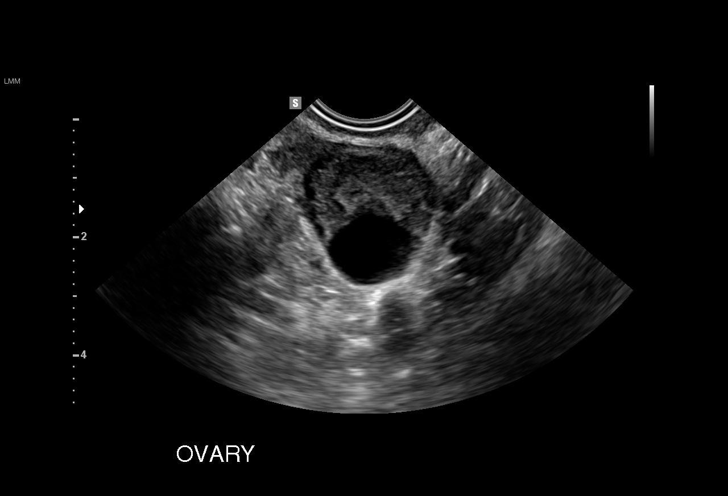
[im 51/55]
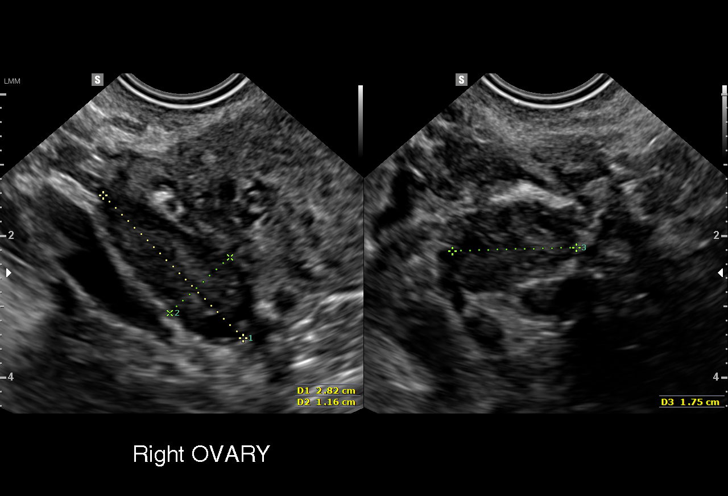
[im 55/55]
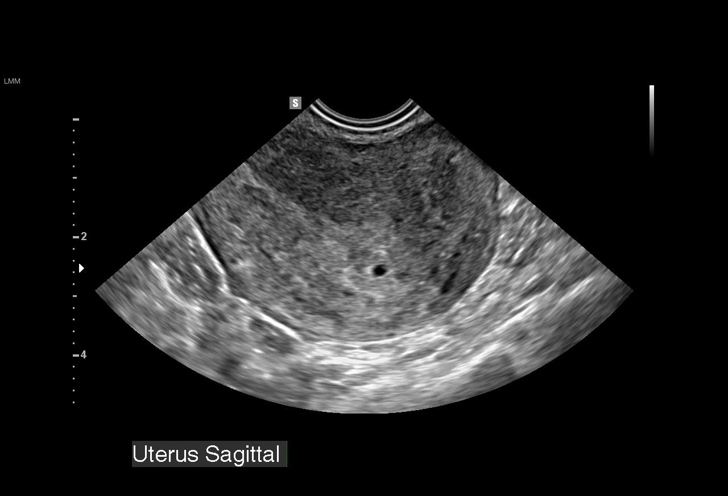

[15 of 28 positions shown; findings below may reference images not displayed]

FINDINGS: Intrauterine gestational sac: Probably seen.

Yolk sac:  No

Embryo:  No

MSD: 2.3 mm   4 w   6  d

Subchorionic hemorrhage:  None visualized.

Maternal uterus/adnexae: The uterus is otherwise unremarkable.

The ovaries are within normal limits. The right ovary measures 2.8 x
1.2 x 1.8 cm, while the left ovary measures 3.4 x 2.7 x 2.4 cm. No
suspicious adnexal masses are seen; there is no evidence for ovarian
torsion.

No free fluid is seen within the pelvic cul-de-sac.
IMPRESSION: Single 2 mm intrauterine gestational sac suggested, corresponding to
a gestational age of 4 weeks 6 days. This matches the gestational
age of 5 weeks 0 days by LMP, reflecting an estimated date of
delivery October 06, 2016.

## 2016-10-21 IMAGING — US US OB TRANSVAGINAL
1 series · 15 of 28 positions shown · non-contrast
Comparison: 02/04/2016

CLINICAL DATA: Follow-up previous ultrasound. Pregnancy of unknown
location.By LMP gestational age is 7 weeks 5 days. EDC by LMP is
10/06/2016.

EXAM:
TRANSVAGINAL OB ULTRASOUND
TECHNIQUE: Transvaginal ultrasound was performed for complete evaluation of the
gestation as well as the maternal uterus, adnexal regions, and
pelvic cul-de-sac.

[Series 1: us ob transvaginal · 31 acquisitions, 15 frames shown]
[im 1/31]
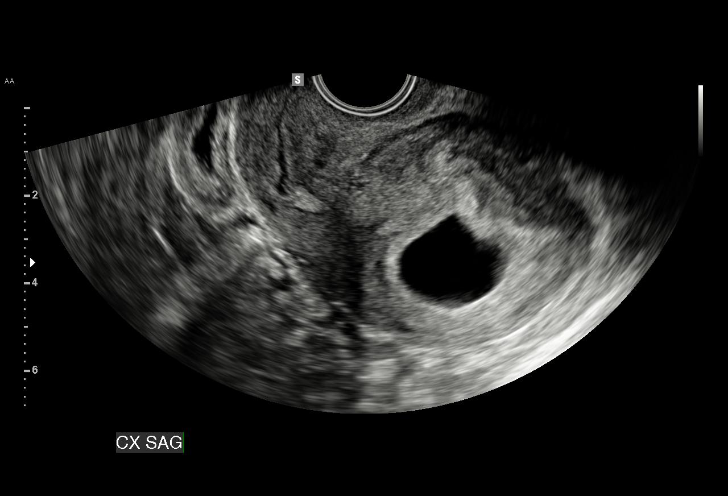
[im 3/31]
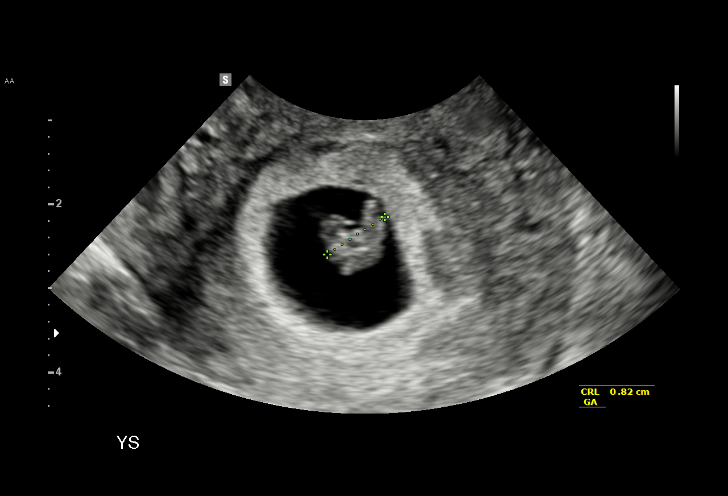
[im 5/31]
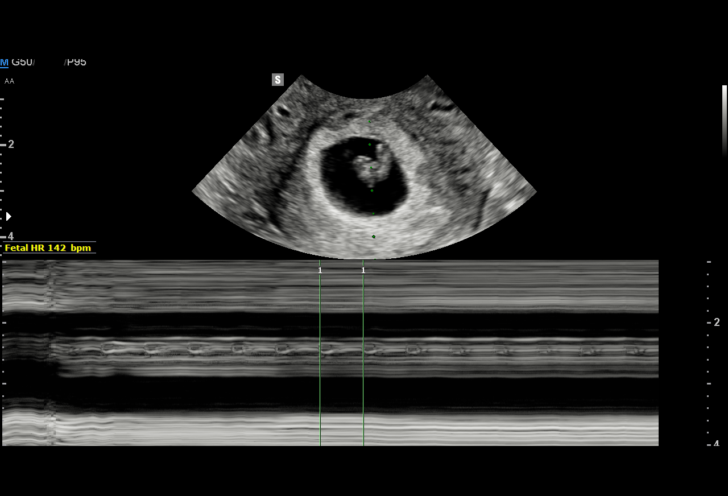
[im 7/31]
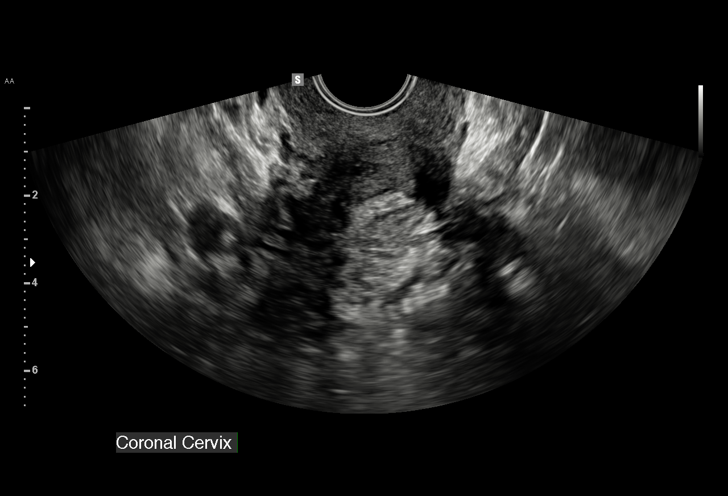
[im 9/31]
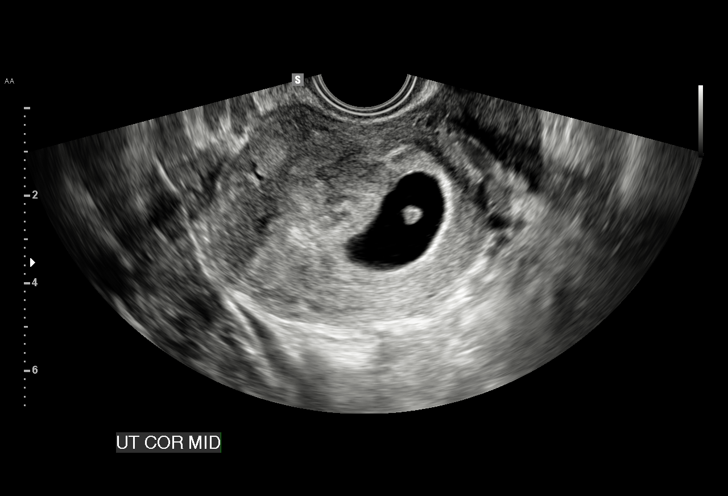
[im 12/31]
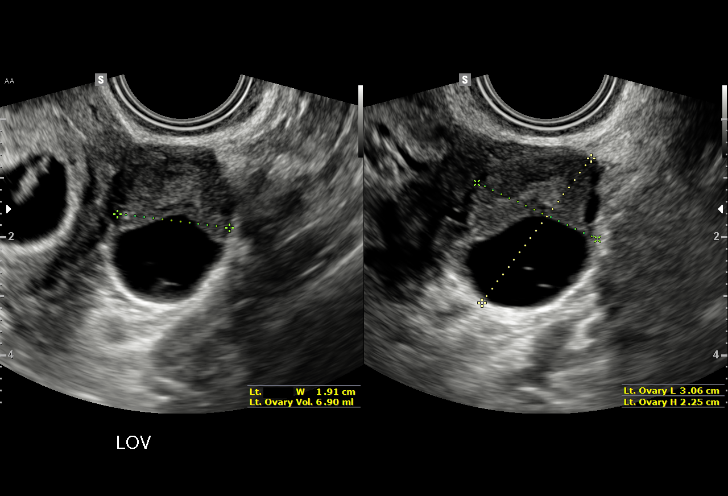
[im 14/31]
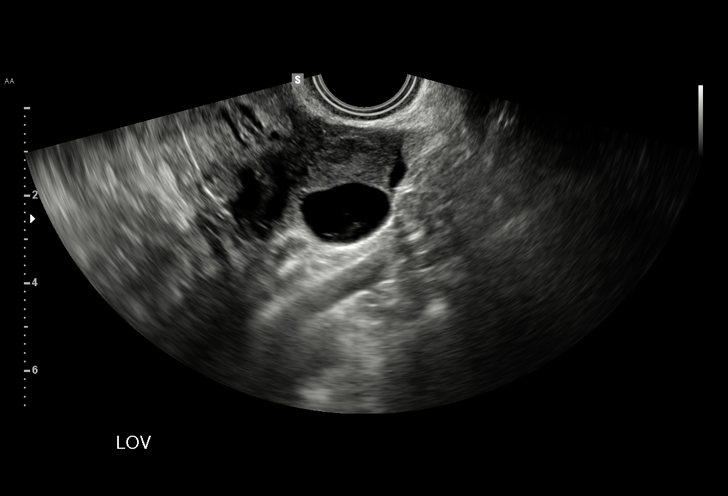
[im 16/31]
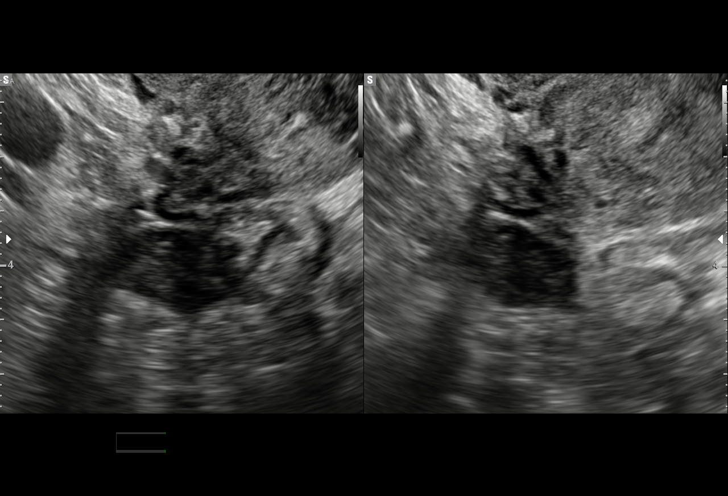
[im 17/31]
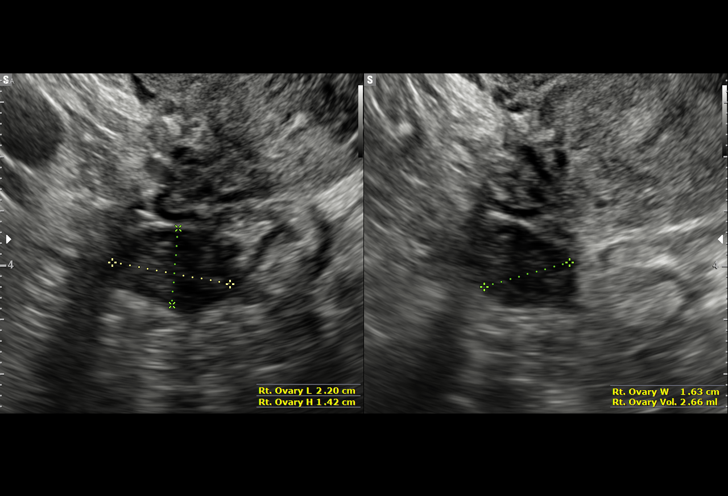
[im 19/31]
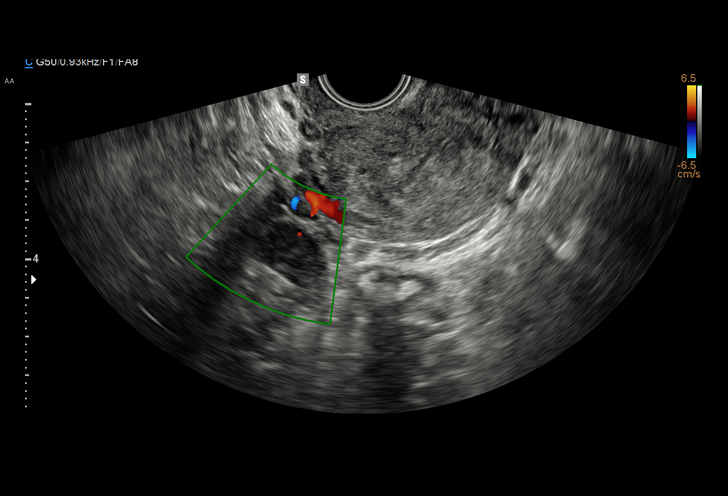
[im 22/31]
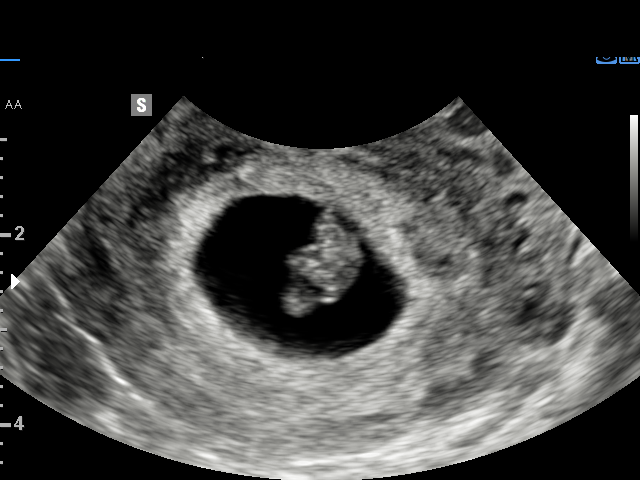
[im 24/31]
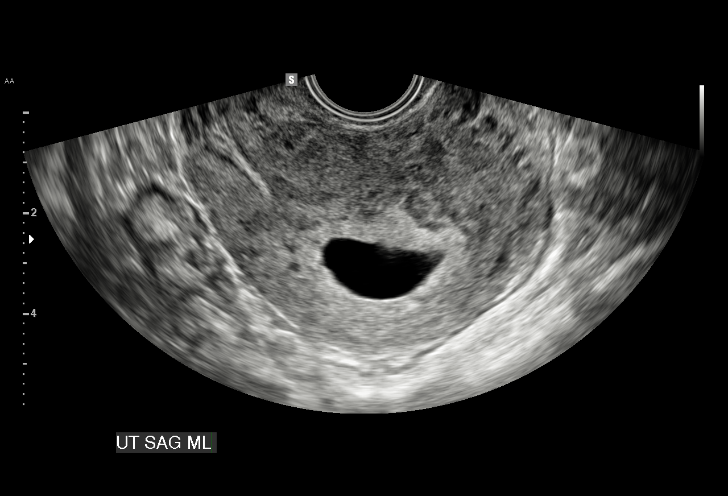
[im 26/31]
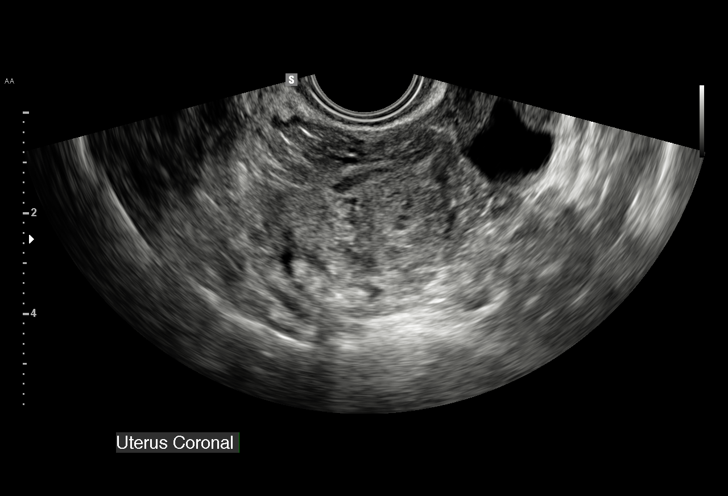
[im 28/31]
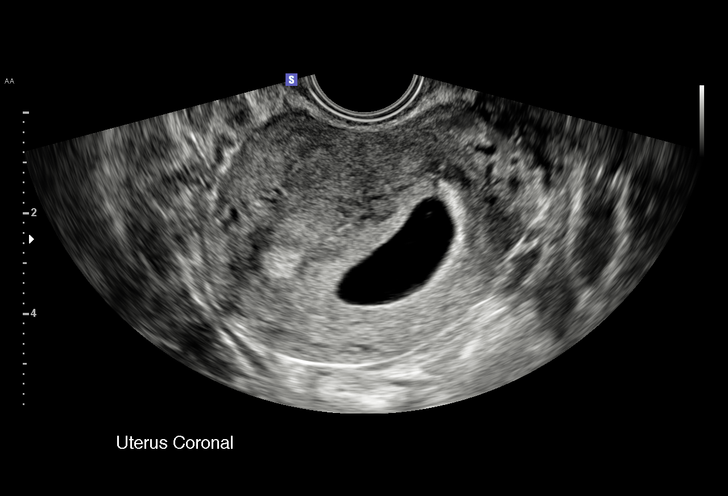
[im 31/31]
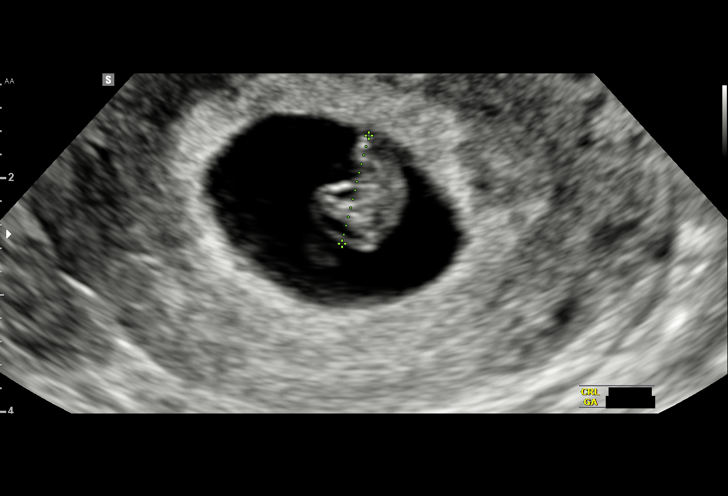

[15 of 28 positions shown; findings below may reference images not displayed]

FINDINGS: Intrauterine gestational sac: Present

Yolk sac:  Present

Embryo:  Present

Cardiac Activity: Present

Heart Rate: 142 bpm

CRL:   9.3  mm   7 w 0 d                  US EDC: 10/11/2016

Subchorionic hemorrhage:  None visualized.

Maternal uterus/adnexae: Ovaries have a normal appearance. Left
corpus luteum cyst is present.
IMPRESSION: 1. Single living intrauterine embryo.
2. Today's ultrasound confirms clinical EDC of 10/06/2016.
3. Left corpus luteum cyst noted.

## 2017-01-13 ENCOUNTER — Encounter (HOSPITAL_COMMUNITY): Payer: Self-pay

## 2017-08-11 ENCOUNTER — Inpatient Hospital Stay (HOSPITAL_COMMUNITY)
Admission: AD | Admit: 2017-08-11 | Discharge: 2017-08-11 | Disposition: A | Discharge status: Home / Self Care | Payer: Medicaid Other | Source: Ambulatory Visit | Attending: Obstetrics and Gynecology | Admitting: Obstetrics and Gynecology

## 2017-08-11 ENCOUNTER — Encounter (HOSPITAL_COMMUNITY): Payer: Self-pay

## 2017-08-11 DIAGNOSIS — F17200 Nicotine dependence, unspecified, uncomplicated: Secondary | ICD-10-CM | POA: Diagnosis not present

## 2017-08-11 DIAGNOSIS — N760 Acute vaginitis: Secondary | ICD-10-CM | POA: Diagnosis not present

## 2017-08-11 DIAGNOSIS — N898 Other specified noninflammatory disorders of vagina: Secondary | ICD-10-CM | POA: Diagnosis present

## 2017-08-11 DIAGNOSIS — B9689 Other specified bacterial agents as the cause of diseases classified elsewhere: Secondary | ICD-10-CM | POA: Insufficient documentation

## 2017-08-11 DIAGNOSIS — Z3202 Encounter for pregnancy test, result negative: Secondary | ICD-10-CM | POA: Insufficient documentation

## 2017-08-11 HISTORY — DX: Gestational diabetes mellitus in pregnancy, unspecified control: O24.419

## 2017-08-11 LAB — URINALYSIS, ROUTINE W REFLEX MICROSCOPIC
BILIRUBIN URINE: NEGATIVE
Glucose, UA: NEGATIVE mg/dL
HGB URINE DIPSTICK: NEGATIVE
KETONES UR: NEGATIVE mg/dL
Nitrite: NEGATIVE
Protein, ur: NEGATIVE mg/dL
Specific Gravity, Urine: 1.027 (ref 1.005–1.030)
pH: 5 (ref 5.0–8.0)

## 2017-08-11 LAB — HCG, SERUM, QUALITATIVE: PREG SERUM: NEGATIVE

## 2017-08-11 LAB — POCT PREGNANCY, URINE: Preg Test, Ur: NEGATIVE

## 2017-08-11 LAB — WET PREP, GENITAL
SPERM: NONE SEEN
Trich, Wet Prep: NONE SEEN
Yeast Wet Prep HPF POC: NONE SEEN

## 2017-08-11 MED ORDER — METRONIDAZOLE 500 MG PO TABS: 500.0000 mg | ORAL_TABLET | Freq: Two times a day (BID) | ORAL | 0 refills | Status: DC

## 2017-08-11 NOTE — MAU Note (Signed)
Pt states she missed her period in September, but in October she had a "light period." States she took a pregnancy test at home and it "had a light positive line." Pt also thinks she has BV because she never finished her medication from September. Pt denies pain and is not currently having bleeding or spotting. LMP:06/19/2017

## 2017-08-11 NOTE — MAU Provider Note (Signed)
Chief Complaint: Vaginal Discharge   First Provider Initiated Contact with Patient 08/11/17 2134      SUBJECTIVE HPI: Mariah Castaneda is a 26 y.o. [id number] who presents to maternity admissions reporting vaginal discharge with odor x 2 weeks and irregular periods with positive home pregnancy test. She reports she missed her period in September and took a home test 08/01/17 which was faintly positive.  She then had 2-3 negative tests before onset of normal menses on 08/04/17.  She was treated 2 months ago for BV but did not complete her medications and feels like the infection has returned with thin malodorous discharge.  There are no other associated symptoms. She denies pain. She denies vaginal bleeding, vaginal itching/burning, urinary symptoms, h/a, dizziness, n/v, or fever/chills.     HPI  Past Medical History:  Diagnosis Date  . Gestational diabetes   . Medical history non-contributory    Past Surgical History:  Procedure Laterality Date  . CESAREAN SECTION    . DILATION AND CURETTAGE OF UTERUS     elective abortion   Social History   Social History  . Marital status: Single    Spouse name: N/A  . Number of children: N/A  . Years of education: N/A   Occupational History  . Not on file.   Social History Main Topics  . Smoking status: Current Some Day Smoker  . Smokeless tobacco: Never Used  . Alcohol use No  . Drug use: No  . Sexual activity: Yes    Birth control/ protection: None     Comment: last sex early august 2018   Other Topics Concern  . Not on file   Social History Narrative  . No narrative on file   No current facility-administered medications on file prior to encounter.    Current Outpatient Prescriptions on File Prior to Encounter  Medication Sig Dispense Refill  . glycopyrrolate (ROBINUL-FORTE) 2 MG tablet Take 1 tablet (2 mg total) by mouth 3 (three) times daily. 30 tablet 1  . Prenatal Vit-Fe Fumarate-FA (PRENATAL VITAMINS PLUS) 27-1 MG TABS Take 1  tablet by mouth once. (Patient not taking: Reported on 03/10/2016) 30 tablet 2  . promethazine (PHENERGAN) 12.5 MG tablet Take 1 tablet (12.5 mg total) by mouth every 6 (six) hours as needed for nausea or vomiting. 30 tablet 0   No Known Allergies  ROS:  Review of Systems  Constitutional: Negative for chills, fatigue and fever.  Respiratory: Negative for shortness of breath.   Cardiovascular: Negative for chest pain.  Gastrointestinal: Negative for nausea and vomiting.  Genitourinary: Positive for vaginal discharge. Negative for difficulty urinating, dysuria, flank pain, pelvic pain, vaginal bleeding and vaginal pain.  Neurological: Negative for dizziness and headaches.  Psychiatric/Behavioral: Negative.      I have reviewed patient's Past Medical Hx, Surgical Hx, Family Hx, Social Hx, medications and allergies.   Physical Exam   Patient Vitals for the past 24 hrs:  BP Temp Temp src Pulse Resp SpO2 Height Weight  08/11/17 2030 111/63 98.6 F (37 C) Oral (!) 109 16 98 % 5' (1.524 m) 172 lb (78 kg)   Constitutional: Well-developed, well-nourished female in no acute distress.  Cardiovascular: normal rate Respiratory: normal effort GI: Abd soft, non-tender. Pos BS x 4 MS: Extremities nontender, no edema, normal ROM Neurologic: Alert and oriented x 4.  GU: Neg CVAT.  PELVIC EXAM: Cervix pink, visually closed, without lesion, small amount thin white discharge, vaginal walls and external genitalia normal Bimanual exam: Cervix 0/long/high, firm,  anterior, neg CMT, uterus nontender, nonenlarged, adnexa without tenderness, enlargement, or mass   LAB RESULTS Results for orders placed or performed during the hospital encounter of 08/11/17 (from the past 24 hour(s))  Urinalysis, Routine w reflex microscopic     Status: Abnormal   Collection Time: 08/11/17  8:27 PM  Result Value Ref Range   Color, Urine YELLOW YELLOW   APPearance HAZY (A) CLEAR   Specific Gravity, Urine 1.027 1.005 -  1.030   pH 5.0 5.0 - 8.0   Glucose, UA NEGATIVE NEGATIVE mg/dL   Hgb urine dipstick NEGATIVE NEGATIVE   Bilirubin Urine NEGATIVE NEGATIVE   Ketones, ur NEGATIVE NEGATIVE mg/dL   Protein, ur NEGATIVE NEGATIVE mg/dL   Nitrite NEGATIVE NEGATIVE   Leukocytes, UA TRACE (A) NEGATIVE   RBC / HPF 0-5 0 - 5 RBC/hpf   WBC, UA 0-5 0 - 5 WBC/hpf   Bacteria, UA RARE (A) NONE SEEN   Squamous Epithelial / LPF 0-5 (A) NONE SEEN   Mucus PRESENT   Pregnancy, urine POC     Status: None   Collection Time: 08/11/17  9:06 PM  Result Value Ref Range   Preg Test, Ur NEGATIVE NEGATIVE  Wet prep, genital     Status: Abnormal   Collection Time: 08/11/17  9:30 PM  Result Value Ref Range   Yeast Wet Prep HPF POC NONE SEEN NONE SEEN   Trich, Wet Prep NONE SEEN NONE SEEN   Clue Cells Wet Prep HPF POC PRESENT (A) NONE SEEN   WBC, Wet Prep HPF POC MANY (A) NONE SEEN   Sperm NONE SEEN        IMAGING No results found.  MAU Management/MDM: Ordered labs and reviewed results.  Qualitative hcg pending.  Will treat BV with Flagyl 500 mg BID x 7 days. Discussed prevention of vaginal infections with probiotics, breathable cotton underwear, no douching or over washing, etc.  Pt to f/u in [facility] for routine gyn care and return to MAU as needed for emergencies. GC swab pending.  Pt stable at time of discharge.  ASSESSMENT 1. BV (bacterial vaginosis)     PLAN Discharge home  Allergies as of 08/11/2017   No Known Allergies     Medication List    TAKE these medications   glycopyrrolate 2 MG tablet Commonly known as:  ROBINUL-FORTE Take 1 tablet (2 mg total) by mouth 3 (three) times daily.   metroNIDAZOLE 500 MG tablet Commonly known as:  FLAGYL Take 1 tablet (500 mg total) by mouth 2 (two) times daily.   PRENATAL VITAMINS PLUS 27-1 MG Tabs Take 1 tablet by mouth once.   promethazine 12.5 MG tablet Commonly known as:  PHENERGAN Take 1 tablet (12.5 mg total) by mouth every 6 (six) hours as needed for  nausea or vomiting.      Follow-up Information    [facility].   Specialty:  Obstetrics and Gynecology Why:  As needed for routine Gyn care, return to MAU as needed for emergencies Contact information: [address] [place] [address] [phone]          Sharen Counter Certified Nurse-Midwife 08/11/2017  10:00 PM

## 2017-08-12 LAB — GC/CHLAMYDIA PROBE AMP (~~LOC~~) NOT AT ARMC
Chlamydia: NEGATIVE
Neisseria Gonorrhea: NEGATIVE

## 2017-08-13 ENCOUNTER — Other Ambulatory Visit (HOSPITAL_COMMUNITY): Payer: Self-pay | Admitting: Obstetrics and Gynecology

## 2017-08-14 ENCOUNTER — Encounter: Payer: Self-pay | Admitting: Advanced Practice Midwife

## 2017-08-15 MED ORDER — PROMETHAZINE HCL 12.5 MG PO TABS: 12.5000 mg | ORAL_TABLET | Freq: Four times a day (QID) | ORAL | 1 refills | Status: AC | PRN

## 2017-08-29 ENCOUNTER — Other Ambulatory Visit (HOSPITAL_COMMUNITY): Payer: Self-pay | Admitting: Advanced Practice Midwife

## 2017-08-29 DIAGNOSIS — B9689 Other specified bacterial agents as the cause of diseases classified elsewhere: Secondary | ICD-10-CM

## 2017-08-29 DIAGNOSIS — N760 Acute vaginitis: Principal | ICD-10-CM

## 2017-09-11 ENCOUNTER — Encounter (HOSPITAL_COMMUNITY): Payer: Self-pay

## 2017-09-11 ENCOUNTER — Emergency Department (HOSPITAL_COMMUNITY)
Admission: EM | Admit: 2017-09-11 | Discharge: 2017-09-11 | Disposition: A | Discharge status: Home / Self Care | Payer: Medicaid Other | Attending: Emergency Medicine | Admitting: Emergency Medicine

## 2017-09-11 ENCOUNTER — Other Ambulatory Visit: Payer: Self-pay

## 2017-09-11 DIAGNOSIS — F17210 Nicotine dependence, cigarettes, uncomplicated: Secondary | ICD-10-CM | POA: Diagnosis not present

## 2017-09-11 DIAGNOSIS — R21 Rash and other nonspecific skin eruption: Secondary | ICD-10-CM | POA: Diagnosis not present

## 2017-09-11 DIAGNOSIS — Z79899 Other long term (current) drug therapy: Secondary | ICD-10-CM | POA: Diagnosis not present

## 2017-09-11 MED ORDER — TRIAMCINOLONE ACETONIDE 0.1 % EX CREA: 1.0000 "application " | TOPICAL_CREAM | Freq: Two times a day (BID) | CUTANEOUS | 0 refills | Status: AC

## 2017-09-11 NOTE — ED Triage Notes (Signed)
Pt presents with 2 small areas "bumps" to L forearm x 2 days.  Pt reports they itch, have small amount of clear fluid.

## 2017-09-11 NOTE — ED Provider Notes (Signed)
[facility]   CSN: [id number] Arrival date & time: 09/11/17  1655     History   Chief Complaint No chief complaint on file.   HPI Mariah Castaneda is a 26 y.o. female who presents to the emergency department with a chief complaint of two pruritic "bumps" to the left forearm that began 2 days ago.  She states that the lesion started out like 2 small mosquito bites, but is been extremely itchy.  She denies purulent drainage, fever, or chills.  She reports that she has a new roommate who has a dog, and she is concerned that the rash may be from having the dog in the house.  The history is provided by the patient. No language interpreter was used.  Rash   This is a new problem. The current episode started 2 days ago. The rash is present on the left arm. The patient is experiencing no pain. Associated symptoms include itching. Pertinent negatives include no blisters, no pain and no weeping. She has tried nothing for the symptoms.    Past Medical History:  Diagnosis Date  . Gestational diabetes   . Medical history non-contributory     Patient Active Problem List   Diagnosis Date Noted  . Ptyalism 03/10/2016  . Nausea and vomiting during pregnancy 03/10/2016    Past Surgical History:  Procedure Laterality Date  . CESAREAN SECTION    . DILATION AND CURETTAGE OF UTERUS     elective abortion    OB History    Gravida Para Term Preterm AB Living   4 3 3   1 3    SAB TAB Ectopic Multiple Live Births     1             Home Medications    Prior to Admission medications   Medication Sig Start Date End Date Taking? Authorizing Provider  glycopyrrolate (ROBINUL-FORTE) 2 MG tablet Take 1 tablet (2 mg total) by mouth 3 (three) times daily. 03/10/16   Poe, Deirdre C, CNM  metroNIDAZOLE (FLAGYL) 500 MG tablet Take 1 tablet (500 mg total) by mouth 2 (two) times daily. 08/11/17   Leftwich-Kirby, Wilmer FloorLisa A, CNM  Prenatal Vit-Fe Fumarate-FA  (PRENATAL VITAMINS PLUS) 27-1 MG TABS Take 1 tablet by mouth once. Patient not taking: Reported on 03/10/2016 02/26/16   Constant, Peggy, MD  promethazine (PHENERGAN) 12.5 MG tablet Take 1 tablet (12.5 mg total) by mouth every 6 (six) hours as needed for nausea or vomiting. 08/15/17   Poe, Deirdre C, CNM  triamcinolone cream (KENALOG) 0.1 % Apply 1 application 2 (two) times daily topically. 09/11/17   Keawe Marcello, Coral ElseMia A, PA-C    Family History History reviewed. No pertinent family history.  Social History Social History   Tobacco Use  . Smoking status: Current Some Day Smoker  . Smokeless tobacco: Never Used  Substance Use Topics  . Alcohol use: No  . Drug use: No     Allergies   Patient has no known allergies.   Review of Systems Review of Systems  Constitutional: Negative for activity change, chills and fever.  Respiratory: Negative for shortness of breath.   Cardiovascular: Negative for chest pain.  Gastrointestinal: Negative for abdominal pain.  Musculoskeletal: Negative for back pain.  Skin: Positive for itching and rash. Negative for color change.     Physical Exam Updated Vital Signs BP (!) 145/71 (BP Location: Right Arm)   Pulse 94   Temp 99 F (37.2 C) (Oral)  Resp 14   Ht 5' (1.524 m)   Wt 72.6 kg (160 lb)   LMP 09/11/2017 (Exact Date)   SpO2 97%   BMI 31.25 kg/m   Physical Exam  Constitutional: No distress.  HENT:  Head: Normocephalic.  Eyes: Conjunctivae are normal.  Neck: Neck supple.  Cardiovascular: Normal rate and regular rhythm. Exam reveals no gallop and no friction rub.  No murmur heard. Pulmonary/Chest: Effort normal. No respiratory distress.  Abdominal: Soft. She exhibits no distension.  Neurological: She is alert.  Skin: Skin is warm. No rash noted.  Two 0.25 cm, round, papular, scaling lesions are noted to the left anterior forearm. The lesions are not grouped.  No fluctuance or induration.  No vesicles.  No surrounding erythema, edema,  or warmth.   Psychiatric: Her behavior is normal.  Nursing note and vitals reviewed.    ED Treatments / Results  Labs (all labs ordered are listed, but only abnormal results are displayed) Labs Reviewed - No data to display  EKG  EKG Interpretation None       Radiology No results found.  Procedures Procedures (including critical care time)  Medications Ordered in ED Medications - No data to display   Initial Impression / Assessment and Plan / ED Course  I have reviewed the triage vital signs and the nursing notes.  Pertinent labs & imaging results that were available during my care of the patient were reviewed by me and considered in my medical decision making (see chart for details).     Rash consistent with contact dermatitis. Patient denies any difficulty breathing or swallowing.  Pt has a patent airway without stridor and is handling secretions without difficulty; no angioedema. No blisters, no pustules, no warmth, no draining sinus tracts, no superficial abscesses, no bullous impetigo, no vesicles, no desquamation, no target lesions with dusky purpura or a central bulla. Not tender to touch. No concern for superimposed infection. No concern for SJS, TEN, TSS, tick borne illness, syphilis or other life-threatening condition. Will discharge home with short course of triamcinolone cream. Discussed pcp f/u if no resolution in 5-7 days. Strict return precautions given. NAD. The patient is safe for d/c at this time.     Final Clinical Impressions(s) / ED Diagnoses   Final diagnoses:  Rash and nonspecific skin eruption    ED Discharge Orders        Ordered    triamcinolone cream (KENALOG) 0.1 %  2 times daily     09/11/17 1752       Mikisha Roseland A, PA-C 09/13/17 0020    Mesner, Barbara CowerJason, MD 09/13/17 0730

## 2017-09-11 NOTE — Discharge Instructions (Signed)
Apply triamcinolone cream to the bumps on her arm twice daily.  If the bumps do not start to improve in the next 5 days, please call the number on your discharge paperwork to get established with a primary care provider to have the bumps reevaluated.  If you develop worsening symptoms, including fever, or if the area around the bones becomes red, hot, or swollen, please return to the emergency department for reevaluation.

## 2017-09-12 ENCOUNTER — Other Ambulatory Visit: Payer: Self-pay | Admitting: Advanced Practice Midwife

## 2017-09-16 MED ORDER — METRONIDAZOLE 500 MG PO TABS: 500.0000 mg | ORAL_TABLET | Freq: Two times a day (BID) | ORAL | 0 refills | Status: DC

## 2017-11-17 ENCOUNTER — Other Ambulatory Visit: Payer: Self-pay | Admitting: Advanced Practice Midwife

## 2017-11-17 ENCOUNTER — Encounter: Payer: Self-pay | Admitting: General Practice

## 2017-11-17 DIAGNOSIS — B9689 Other specified bacterial agents as the cause of diseases classified elsewhere: Secondary | ICD-10-CM

## 2017-11-17 DIAGNOSIS — N760 Acute vaginitis: Principal | ICD-10-CM

## 2022-06-03 ENCOUNTER — Ambulatory Visit (HOSPITAL_COMMUNITY)
Admission: EM | Admit: 2022-06-03 | Discharge: 2022-06-03 | Disposition: A | Discharge status: Home / Self Care | Payer: Medicaid Other | Attending: Family Medicine | Admitting: Family Medicine

## 2022-06-03 ENCOUNTER — Encounter (HOSPITAL_COMMUNITY): Payer: Self-pay | Admitting: Emergency Medicine

## 2022-06-03 DIAGNOSIS — N898 Other specified noninflammatory disorders of vagina: Secondary | ICD-10-CM | POA: Insufficient documentation

## 2022-06-03 DIAGNOSIS — Z113 Encounter for screening for infections with a predominantly sexual mode of transmission: Secondary | ICD-10-CM | POA: Diagnosis not present

## 2022-06-03 MED ORDER — METRONIDAZOLE 0.75 % VA GEL: VAGINAL | 0 refills | Status: AC

## 2022-06-03 NOTE — ED Triage Notes (Signed)
Pt reports vaginal discharge, vaginal odor, and intermittent right side pelvic pain  x 1 week. States she believes it could be BV but requesting to be tested for STIs as well.

## 2022-06-03 NOTE — Discharge Instructions (Signed)
We have sent testing for sexually transmitted infections. We will notify you of any positive results once they are received. If required, we will prescribe any medications you might need.  Please refrain from all sexual activity for at least the next seven days.  

## 2022-06-04 LAB — CERVICOVAGINAL ANCILLARY ONLY
Bacterial Vaginitis (gardnerella): POSITIVE — AB
Candida Glabrata: NEGATIVE
Candida Vaginitis: NEGATIVE
Chlamydia: NEGATIVE
Comment: NEGATIVE
Comment: NEGATIVE
Comment: NEGATIVE
Comment: NEGATIVE
Comment: NEGATIVE
Comment: NORMAL
Neisseria Gonorrhea: NEGATIVE
Trichomonas: NEGATIVE

## 2022-06-05 NOTE — ED Provider Notes (Signed)
  [facility]   [id number] 06/03/22 Arrival Time: 1104  ASSESSMENT & PLAN:  1. Vaginal discharge    Meds ordered this encounter  Medications   metroNIDAZOLE (METROGEL VAGINAL) 0.75 % vaginal gel    Sig: 1 applicator (37.5 mg) intravaginally once daily for 5 days at bedtime    Dispense:  70 g    Refill:  0  Gel at pt request. Cytology pending.     Discharge Instructions      We have sent testing for sexually transmitted infections. We will notify you of any positive results once they are received. If required, we will prescribe any medications you might need.  Please refrain from all sexual activity for at least the next seven days.     Without s/s of PID.  Labs Reviewed  CERVICOVAGINAL ANCILLARY ONLY - Abnormal; Notable for the following components:      Result Value   Bacterial Vaginitis (gardnerella) Positive (*)    All other components within normal limits     Will notify of any positive results. Instructed to refrain from sexual activity for at least seven days.  Reviewed expectations re: course of current medical issues. Questions answered. Outlined signs and symptoms indicating need for more acute intervention. Patient verbalized understanding. After Visit Summary given.   SUBJECTIVE:  Mariah Castaneda is a 31 y.o. female who presents with complaint of vaginal discharge. Feels like BV; h/o. Onset gradual. First noticed a week ago. No specific aggravating or alleviating factors reported. Denies: urinary frequency, dysuria, and gross hematuria. Afebrile. No abdominal or pelvic pain. Normal PO intake wihout n/v. No genital rashes or lesions. Reports that she is sexually active. No tx PTA.  OBJECTIVE:  Vitals:   06/03/22 1139  BP: 119/69  Pulse: 91  Resp: 16  Temp: 98 F (36.7 C)  TempSrc: Oral  SpO2: 100%     General appearance: alert, cooperative, appears stated age and no distress Lungs: unlabored respirations; speaks full sentences without  difficulty Back: no CVA tenderness; FROM at waist Abdomen: soft, non-tender GU: deferred Skin: warm and dry Psychological: alert and cooperative; normal mood and affect.   No Known Allergies  Past Medical History:  Diagnosis Date   Gestational diabetes    Medical history non-contributory    History reviewed. No pertinent family history. Social History   Socioeconomic History   Marital status: Single    Spouse name: Not on file   Number of children: Not on file   Years of education: Not on file   Highest education level: Not on file  Occupational History   Not on file  Tobacco Use   Smoking status: Some Days   Smokeless tobacco: Never  Substance and Sexual Activity   Alcohol use: No   Drug use: No   Sexual activity: Yes    Birth control/protection: None    Comment: last sex early august 2018  Other Topics Concern   Not on file  Social History Narrative   Not on file   Social Determinants of Health   Financial Resource Strain: Not on file  Food Insecurity: Not on file  Transportation Needs: Not on file  Physical Activity: Not on file  Stress: Not on file  Social Connections: Not on file  Intimate Partner Violence: Not on file           [place], MD 06/05/22 [phone]
